# Patient Record
Sex: Female | Born: 1950 | Race: Asian | Hispanic: No | Marital: Married | Smoking: Never smoker
Health system: Southern US, Community
[De-identification: ages and names within clinical notes are randomized; demographics above are authoritative.]

## PROBLEM LIST (undated history)

## (undated) HISTORY — PX: CYSTOSCOPY KIDNEY W/ URETERAL GUIDE WIRE: SUR371

## (undated) HISTORY — PX: OTHER SURGICAL HISTORY: SHX169

---

## 2000-03-28 ENCOUNTER — Encounter: Admission: RE | Admit: 2000-03-28 | Discharge: 2000-03-28 | Payer: Self-pay | Admitting: Family Medicine

## 2000-03-28 ENCOUNTER — Encounter: Payer: Self-pay | Admitting: Family Medicine

## 2002-01-05 ENCOUNTER — Other Ambulatory Visit: Admission: RE | Admit: 2002-01-05 | Discharge: 2002-01-05 | Payer: Self-pay | Admitting: Family Medicine

## 2003-04-09 HISTORY — PX: MASTECTOMY, MODIFIED RADICAL W/RECONSTRUCTION: SHX708

## 2003-07-15 ENCOUNTER — Other Ambulatory Visit: Admission: RE | Admit: 2003-07-15 | Discharge: 2003-07-15 | Payer: Self-pay | Admitting: Family Medicine

## 2003-08-04 ENCOUNTER — Encounter: Admission: RE | Admit: 2003-08-04 | Discharge: 2003-08-04 | Payer: Self-pay | Admitting: General Surgery

## 2003-08-04 ENCOUNTER — Encounter (INDEPENDENT_AMBULATORY_CARE_PROVIDER_SITE_OTHER): Payer: Self-pay | Admitting: Specialist

## 2003-08-09 ENCOUNTER — Encounter (HOSPITAL_COMMUNITY): Admission: RE | Admit: 2003-08-09 | Discharge: 2003-11-07 | Payer: Self-pay | Admitting: General Surgery

## 2003-09-02 ENCOUNTER — Encounter (INDEPENDENT_AMBULATORY_CARE_PROVIDER_SITE_OTHER): Payer: Self-pay | Admitting: *Deleted

## 2003-09-02 ENCOUNTER — Inpatient Hospital Stay (HOSPITAL_COMMUNITY): Admission: RE | Admit: 2003-09-02 | Discharge: 2003-09-05 | Payer: Self-pay | Admitting: General Surgery

## 2003-10-05 ENCOUNTER — Ambulatory Visit (HOSPITAL_COMMUNITY): Admission: RE | Admit: 2003-10-05 | Discharge: 2003-10-05 | Payer: Self-pay | Admitting: Oncology

## 2003-10-06 ENCOUNTER — Ambulatory Visit (HOSPITAL_COMMUNITY): Admission: RE | Admit: 2003-10-06 | Discharge: 2003-10-06 | Payer: Self-pay | Admitting: Oncology

## 2003-10-12 ENCOUNTER — Ambulatory Visit (HOSPITAL_BASED_OUTPATIENT_CLINIC_OR_DEPARTMENT_OTHER): Admission: RE | Admit: 2003-10-12 | Discharge: 2003-10-12 | Payer: Self-pay | Admitting: General Surgery

## 2003-10-26 ENCOUNTER — Ambulatory Visit: Admission: RE | Admit: 2003-10-26 | Discharge: 2003-10-26 | Payer: Self-pay | Admitting: Oncology

## 2003-10-26 ENCOUNTER — Encounter: Payer: Self-pay | Admitting: Cardiovascular Disease

## 2004-01-02 ENCOUNTER — Ambulatory Visit (HOSPITAL_COMMUNITY): Admission: RE | Admit: 2004-01-02 | Discharge: 2004-01-02 | Payer: Self-pay | Admitting: Obstetrics and Gynecology

## 2004-02-07 ENCOUNTER — Ambulatory Visit: Payer: Self-pay | Admitting: Oncology

## 2004-02-27 ENCOUNTER — Ambulatory Visit: Admission: RE | Admit: 2004-02-27 | Discharge: 2004-05-04 | Payer: Self-pay | Admitting: Radiation Oncology

## 2004-03-26 ENCOUNTER — Ambulatory Visit (HOSPITAL_COMMUNITY): Admission: RE | Admit: 2004-03-26 | Discharge: 2004-03-26 | Payer: Self-pay | Admitting: Obstetrics and Gynecology

## 2004-04-06 ENCOUNTER — Ambulatory Visit (HOSPITAL_BASED_OUTPATIENT_CLINIC_OR_DEPARTMENT_OTHER): Admission: RE | Admit: 2004-04-06 | Discharge: 2004-04-06 | Payer: Self-pay | Admitting: General Surgery

## 2004-04-08 HISTORY — PX: TOTAL ABDOMINAL HYSTERECTOMY: SHX209

## 2004-04-17 ENCOUNTER — Ambulatory Visit: Payer: Self-pay | Admitting: Oncology

## 2004-07-12 ENCOUNTER — Ambulatory Visit: Admission: RE | Admit: 2004-07-12 | Discharge: 2004-07-12 | Payer: Self-pay | Admitting: Radiation Oncology

## 2004-07-25 ENCOUNTER — Ambulatory Visit: Payer: Self-pay | Admitting: Oncology

## 2004-08-02 ENCOUNTER — Ambulatory Visit: Admission: RE | Admit: 2004-08-02 | Discharge: 2004-08-02 | Payer: Self-pay | Admitting: Radiation Oncology

## 2004-08-09 ENCOUNTER — Ambulatory Visit: Admission: RE | Admit: 2004-08-09 | Discharge: 2004-08-09 | Payer: Self-pay | Admitting: Radiation Oncology

## 2004-08-21 ENCOUNTER — Encounter: Admission: RE | Admit: 2004-08-21 | Discharge: 2004-08-21 | Payer: Self-pay | Admitting: Oncology

## 2004-11-23 ENCOUNTER — Ambulatory Visit: Payer: Self-pay | Admitting: Oncology

## 2004-11-26 ENCOUNTER — Ambulatory Visit (HOSPITAL_COMMUNITY): Admission: RE | Admit: 2004-11-26 | Discharge: 2004-11-26 | Payer: Self-pay | Admitting: Obstetrics and Gynecology

## 2005-01-09 ENCOUNTER — Other Ambulatory Visit: Admission: RE | Admit: 2005-01-09 | Discharge: 2005-01-09 | Payer: Self-pay | Admitting: Obstetrics and Gynecology

## 2005-03-02 ENCOUNTER — Emergency Department (HOSPITAL_COMMUNITY): Admission: EM | Admit: 2005-03-02 | Discharge: 2005-03-02 | Payer: Self-pay | Admitting: Emergency Medicine

## 2005-03-15 ENCOUNTER — Encounter: Admission: RE | Admit: 2005-03-15 | Discharge: 2005-03-15 | Payer: Self-pay | Admitting: Family Medicine

## 2005-03-27 ENCOUNTER — Ambulatory Visit: Payer: Self-pay | Admitting: Oncology

## 2005-06-12 IMAGING — CT CT CHEST W/ CM
1 of 5 series · 11 of 32 positions shown, 17 images · IV contrast (agent unspecified)
Comparison: None.

CLINICAL DATA: History of breast cancer.  Status post right mastectomy and tram flap.  

CHEST, ABDOMEN, AND PELVIS CT WITH CONTRAST, 10/05/03
TECHNIQUE: Contiguous axial images of the chest, abdomen, and pelvis were obtained after administration of oral and 150 cc of nonionic intravenous contrast.

[Series 4: a/p 5.0 b30f · axial · 0.74mm/px · z∈[-614,-259]mm · 11 of 87 slices shown, 17 images]
[im 8/87  soft-tissue]
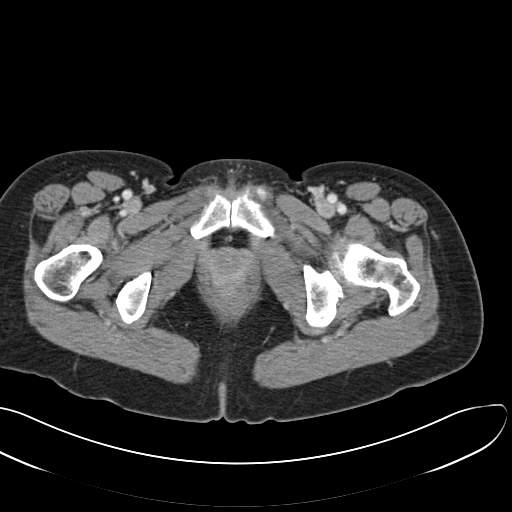
[im 8/87  bone]
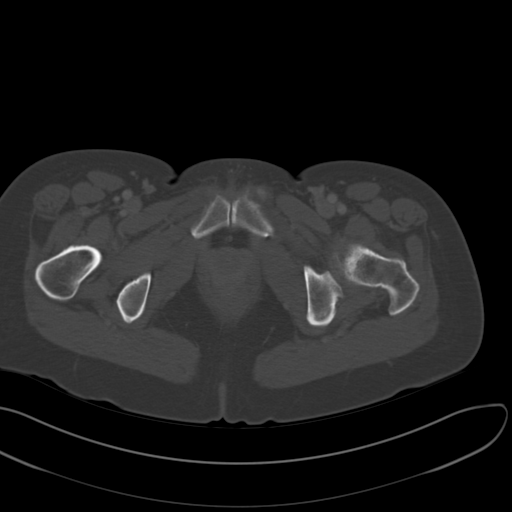
[im 15/87  soft-tissue]
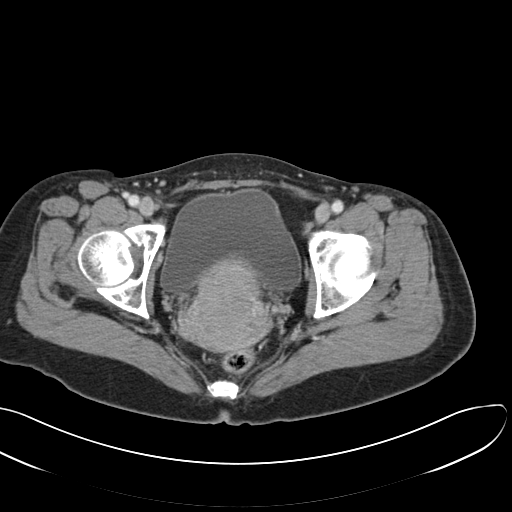
[im 22/87  soft-tissue]
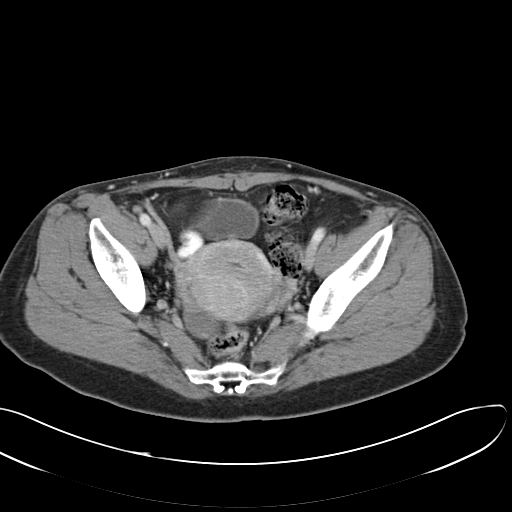
[im 29/87  soft-tissue]
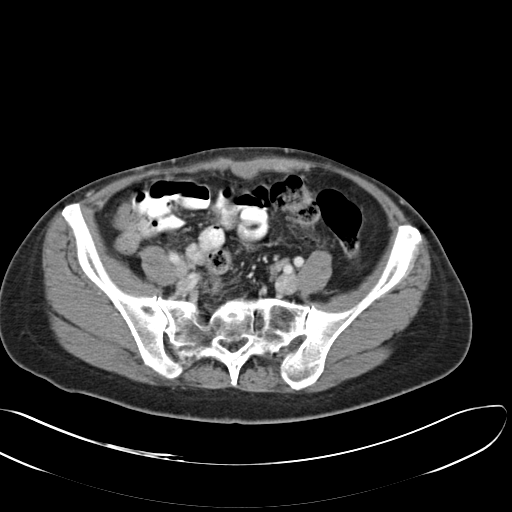
[im 36/87  soft-tissue]
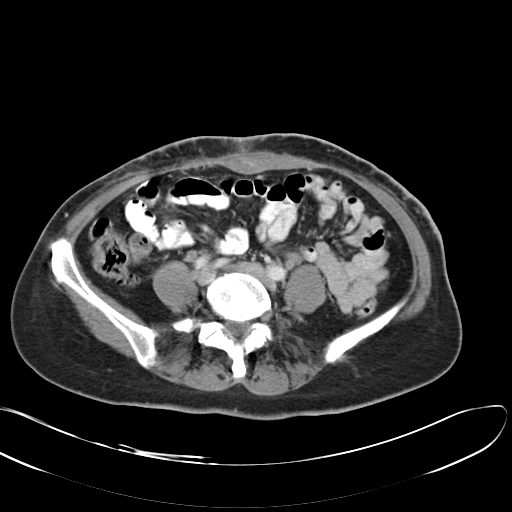
[im 44/87  soft-tissue]
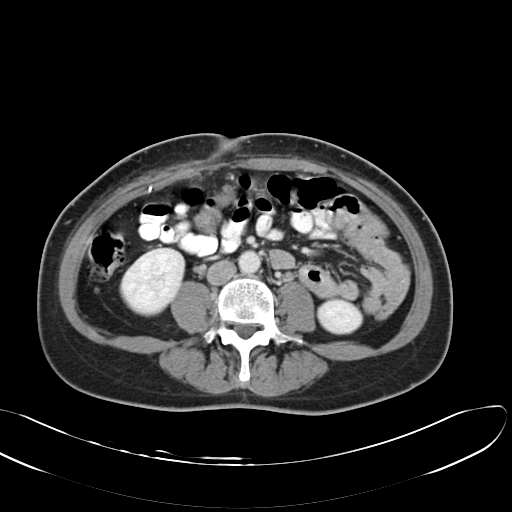
[im 51/87  soft-tissue]
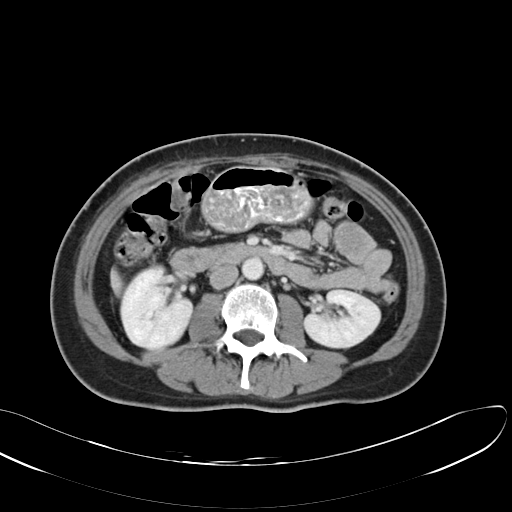
[im 58/87  soft-tissue]
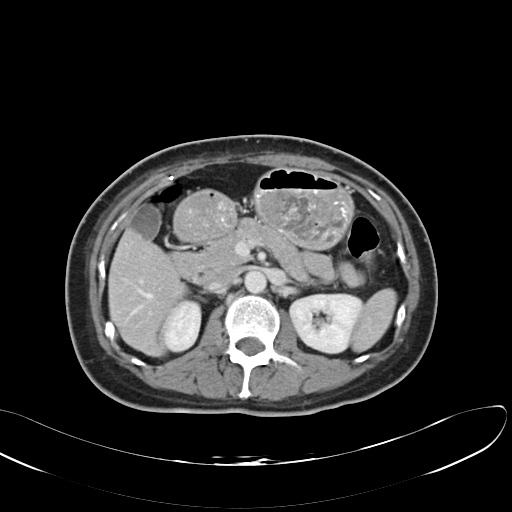
[im 58/87  lung]
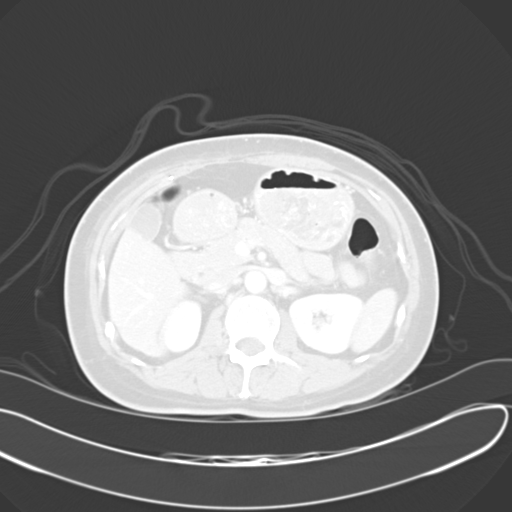
[im 65/87  soft-tissue]
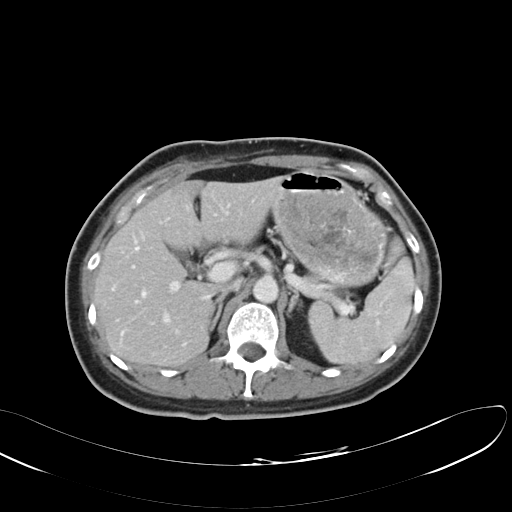
[im 65/87  lung]
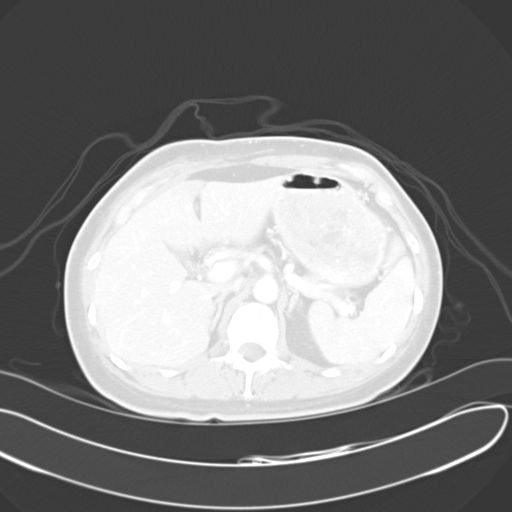
[im 65/87  bone]
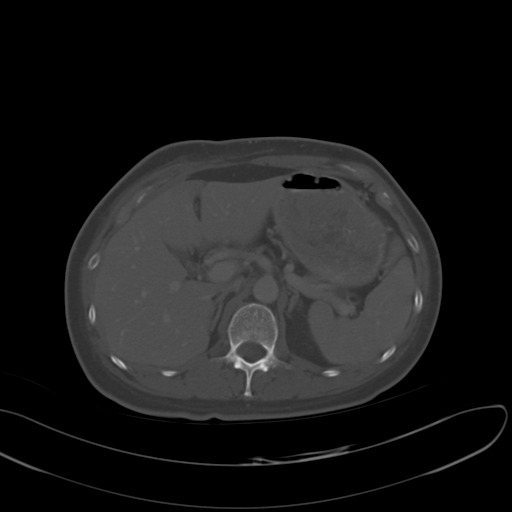
[im 72/87  soft-tissue]
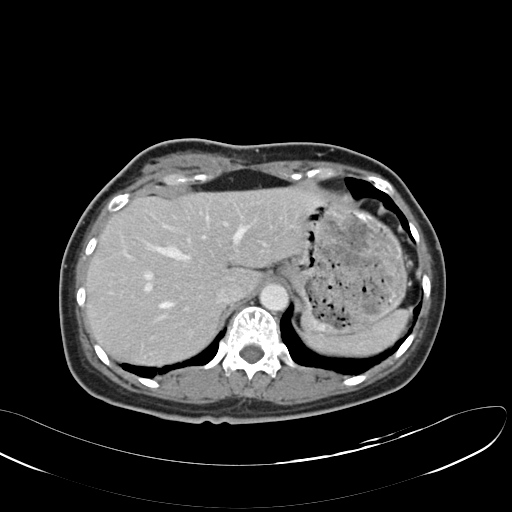
[im 72/87  lung]
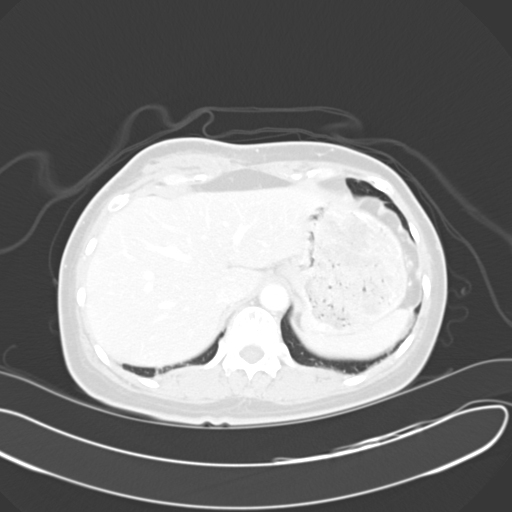
[im 79/87  soft-tissue]
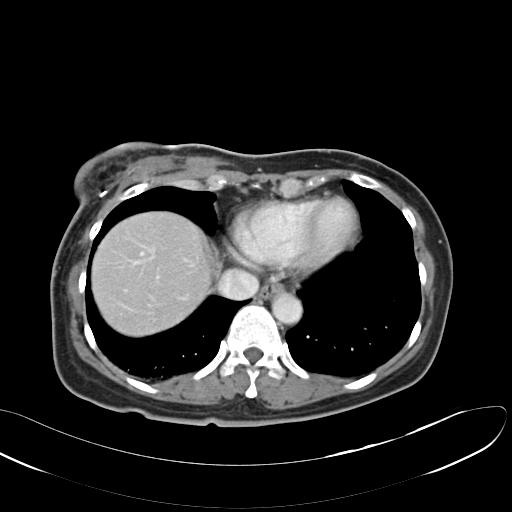
[im 79/87  lung]
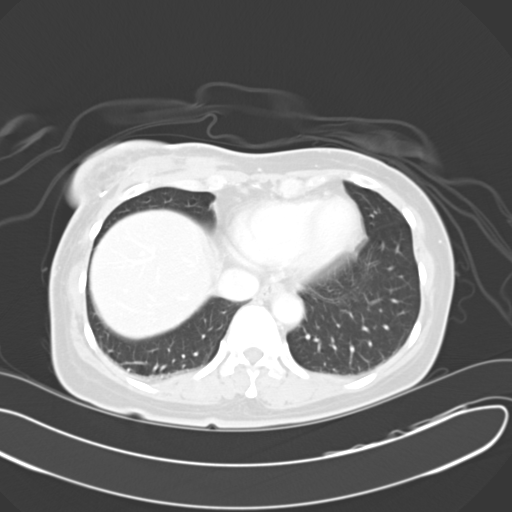

[11 of 32 positions shown; findings below may reference images not displayed]

CHEST:
There is no evidence of axillary lymphadenopathy.  No lymphadenopathy is seen in the mediastinum or either hilar region.  The heart size is normal without evidence of pericardial effusion.   There is no evidence for pleural effusion.  

Post surgical change in the right anterior abdominal wall is consistent with tram flap placement.  

Lung windows show a 4 mm nodule in the right costophrenic recess.  No parenchymal nodules or masses are seen in the left lung. 

IMPRESSION
4 mm nodule in the right costophrenic recess.  Review of the PET images from today demonstrates no increased uptake at this location.  While the lack of increased FDG uptake is reassuring, at this size, PET can be insensitive.  This lesion will require continued attenuation on follow-up imaging. 

Post surgical change in the right anterior right chest wall consistent with tram flap reconstruction.  

ABDOMEN
No focal abnormality is seen in the spleen.  A focus of low attenuation at the anterior capsule liver adjacent to the falciform ligament is in a characteristic location for focal fatty infiltration or anomalous perfusion.  A 15 mm lesion at the posterior capsule of the lateral segment of the left liver has apparent peripheral nodular enhancement.  This area was not imaged in its entirety on the renal delay sequence.  Given the subcapsular location and the peripheral nodular enhancement, this is felt to be most consistent with a cavernous hemangioma.  Review of the PET images from today demonstrates no increased uptake at this location.  

The stomach, duodenum, pancreas, and adrenal glands are unremarkable.  Prominent fold noted in the gallbladder fundus.  No focal abnormality identified in either kidney.  There is no retroperitoneal lymphadenopathy.  No intraperitoneal free fluid.  Post surgical change in the right anterior abdominal wall consistent with tram flap reconstruction. 

IMPRESSION
15 mm lesion in the posterior aspect of the lateral segment of the left liver has imaging features most consistent with benign cavernous hemangioma.  There is no increased uptake at this location on the PET CT.  Continued attenuation on follow-up imaging is recommended.  

PELVIS 
Imaging through the anatomic pelvis reveals no evidence for free fluid.  The uterus is enlarged with an irregular contour and a prominent posterior fibroid.  3.2 cm cystic lesion posterior to the right uterus is likely related to the right ovary.  There is no evidence for a left adnexal mass although a 19 mm cystic focus in the left adnexal space may be related to a dominant ovarian follicle.  

IMPRESSION
No evidence for metastatic disease in the anatomic pelvis. 

Fibroid change in the uterus.  

3.2 cm right adnexal cyst.  Follow-up ultrasound in six weeks is recommended to ensure that this resolves.  

[REDACTED]

## 2005-06-14 ENCOUNTER — Ambulatory Visit: Payer: Self-pay | Admitting: Oncology

## 2005-07-03 ENCOUNTER — Encounter: Admission: RE | Admit: 2005-07-03 | Discharge: 2005-07-03 | Payer: Self-pay | Admitting: Oncology

## 2005-10-07 ENCOUNTER — Ambulatory Visit: Payer: Self-pay | Admitting: Oncology

## 2005-10-15 ENCOUNTER — Encounter: Admission: RE | Admit: 2005-10-15 | Discharge: 2005-10-15 | Payer: Self-pay | Admitting: Oncology

## 2005-10-15 LAB — CBC WITH DIFFERENTIAL/PLATELET
Eosinophils Absolute: 0.1 10*3/uL (ref 0.0–0.5)
HCT: 36.8 % (ref 34.8–46.6)
LYMPH%: 33 % (ref 14.0–48.0)
MCHC: 34.8 g/dL (ref 32.0–36.0)
MCV: 93.7 fL (ref 81.0–101.0)
MONO#: 0.3 10*3/uL (ref 0.1–0.9)
MONO%: 8.5 % (ref 0.0–13.0)
NEUT#: 1.8 10*3/uL (ref 1.5–6.5)
NEUT%: 55.9 % (ref 39.6–76.8)
Platelets: 172 10*3/uL (ref 145–400)
RBC: 3.93 10*6/uL (ref 3.70–5.32)
WBC: 3.2 10*3/uL — ABNORMAL LOW (ref 3.9–10.0)

## 2005-10-15 LAB — COMPREHENSIVE METABOLIC PANEL
BUN: 13 mg/dL (ref 6–23)
CO2: 25 mEq/L (ref 19–32)
Calcium: 8.8 mg/dL (ref 8.4–10.5)
Chloride: 108 mEq/L (ref 96–112)
Creatinine, Ser: 0.74 mg/dL (ref 0.40–1.20)
Total Bilirubin: 0.8 mg/dL (ref 0.3–1.2)

## 2005-10-24 ENCOUNTER — Ambulatory Visit (HOSPITAL_COMMUNITY): Admission: RE | Admit: 2005-10-24 | Discharge: 2005-10-24 | Payer: Self-pay | Admitting: Oncology

## 2005-11-21 ENCOUNTER — Other Ambulatory Visit: Admission: RE | Admit: 2005-11-21 | Discharge: 2005-11-21 | Payer: Self-pay | Admitting: Obstetrics and Gynecology

## 2006-05-22 ENCOUNTER — Ambulatory Visit: Payer: Self-pay | Admitting: Oncology

## 2006-10-09 ENCOUNTER — Ambulatory Visit: Payer: Self-pay | Admitting: Oncology

## 2006-10-14 LAB — CBC WITH DIFFERENTIAL/PLATELET
BASO%: 0.5 % (ref 0.0–2.0)
Basophils Absolute: 0 10*3/uL (ref 0.0–0.1)
Eosinophils Absolute: 0 10*3/uL (ref 0.0–0.5)
HCT: 39.9 % (ref 34.8–46.6)
HGB: 14.3 g/dL (ref 11.6–15.9)
MONO#: 0.4 10*3/uL (ref 0.1–0.9)
NEUT#: 2.1 10*3/uL (ref 1.5–6.5)
NEUT%: 52.9 % (ref 39.6–76.8)
Platelets: 182 10*3/uL (ref 145–400)
WBC: 4 10*3/uL (ref 3.9–10.0)
lymph#: 1.4 10*3/uL (ref 0.9–3.3)

## 2006-10-14 LAB — COMPREHENSIVE METABOLIC PANEL
ALT: 18 U/L (ref 0–35)
BUN: 11 mg/dL (ref 6–23)
CO2: 25 mEq/L (ref 19–32)
Calcium: 9.8 mg/dL (ref 8.4–10.5)
Chloride: 105 mEq/L (ref 96–112)
Creatinine, Ser: 0.68 mg/dL (ref 0.40–1.20)
Glucose, Bld: 78 mg/dL (ref 70–99)

## 2006-10-14 LAB — CANCER ANTIGEN 27.29: CA 27.29: 8 U/mL (ref 0–39)

## 2006-10-30 ENCOUNTER — Encounter: Admission: RE | Admit: 2006-10-30 | Discharge: 2006-10-30 | Payer: Self-pay | Admitting: Oncology

## 2007-02-26 ENCOUNTER — Ambulatory Visit: Payer: Self-pay | Admitting: Oncology

## 2007-02-26 ENCOUNTER — Ambulatory Visit (HOSPITAL_COMMUNITY): Admission: RE | Admit: 2007-02-26 | Discharge: 2007-02-26 | Payer: Self-pay | Admitting: Family Medicine

## 2007-02-26 LAB — BASIC METABOLIC PANEL
Calcium: 9.4 mg/dL (ref 8.4–10.5)
Chloride: 106 mEq/L (ref 96–112)
Creatinine, Ser: 0.56 mg/dL (ref 0.40–1.20)
Sodium: 144 mEq/L (ref 135–145)

## 2007-03-25 ENCOUNTER — Other Ambulatory Visit: Admission: RE | Admit: 2007-03-25 | Discharge: 2007-03-25 | Payer: Self-pay | Admitting: Obstetrics and Gynecology

## 2007-04-13 ENCOUNTER — Ambulatory Visit: Payer: Self-pay | Admitting: Oncology

## 2007-04-23 ENCOUNTER — Ambulatory Visit (HOSPITAL_COMMUNITY): Admission: RE | Admit: 2007-04-23 | Discharge: 2007-04-23 | Payer: Self-pay | Admitting: Oncology

## 2007-09-22 ENCOUNTER — Other Ambulatory Visit: Admission: RE | Admit: 2007-09-22 | Discharge: 2007-09-22 | Payer: Self-pay | Admitting: Obstetrics and Gynecology

## 2007-11-03 ENCOUNTER — Encounter: Admission: RE | Admit: 2007-11-03 | Discharge: 2007-11-03 | Payer: Self-pay | Admitting: Oncology

## 2007-12-09 ENCOUNTER — Ambulatory Visit: Payer: Self-pay | Admitting: Oncology

## 2007-12-15 LAB — CBC WITH DIFFERENTIAL/PLATELET
BASO%: 0.9 % (ref 0.0–2.0)
EOS%: 2.3 % (ref 0.0–7.0)
HGB: 13.9 g/dL (ref 11.6–15.9)
MCH: 32.3 pg (ref 26.0–34.0)
MCHC: 33.7 g/dL (ref 32.0–36.0)
MCV: 95.9 fL (ref 81.0–101.0)
MONO%: 10.7 % (ref 0.0–13.0)
RBC: 4.29 10*6/uL (ref 3.70–5.32)
RDW: 11.6 % (ref 11.3–14.5)
lymph#: 1.2 10*3/uL (ref 0.9–3.3)

## 2007-12-16 LAB — COMPREHENSIVE METABOLIC PANEL
ALT: 20 U/L (ref 0–35)
AST: 19 U/L (ref 0–37)
Albumin: 4.4 g/dL (ref 3.5–5.2)
Alkaline Phosphatase: 61 U/L (ref 39–117)
BUN: 13 mg/dL (ref 6–23)
Calcium: 9.5 mg/dL (ref 8.4–10.5)
Chloride: 106 mEq/L (ref 96–112)
Potassium: 3.9 mEq/L (ref 3.5–5.3)

## 2008-06-01 ENCOUNTER — Other Ambulatory Visit: Admission: RE | Admit: 2008-06-01 | Discharge: 2008-06-01 | Payer: Self-pay | Admitting: Obstetrics and Gynecology

## 2008-10-14 ENCOUNTER — Ambulatory Visit: Payer: Self-pay | Admitting: Oncology

## 2008-10-18 LAB — CBC WITH DIFFERENTIAL/PLATELET
BASO%: 0.4 % (ref 0.0–2.0)
HCT: 39.3 % (ref 34.8–46.6)
HGB: 13.8 g/dL (ref 11.6–15.9)
MCHC: 35.2 g/dL (ref 31.5–36.0)
MONO#: 0.3 10*3/uL (ref 0.1–0.9)
NEUT%: 52.6 % (ref 38.4–76.8)
RDW: 12.5 % (ref 11.2–14.5)
WBC: 3.7 10*3/uL — ABNORMAL LOW (ref 3.9–10.3)
lymph#: 1.3 10*3/uL (ref 0.9–3.3)

## 2008-10-19 LAB — COMPREHENSIVE METABOLIC PANEL
ALT: 15 U/L (ref 0–35)
AST: 17 U/L (ref 0–37)
Albumin: 4.2 g/dL (ref 3.5–5.2)
Alkaline Phosphatase: 53 U/L (ref 39–117)
BUN: 11 mg/dL (ref 6–23)
Calcium: 9.1 mg/dL (ref 8.4–10.5)
Chloride: 108 mEq/L (ref 96–112)
Potassium: 4 mEq/L (ref 3.5–5.3)
Sodium: 140 mEq/L (ref 135–145)
Total Protein: 7 g/dL (ref 6.0–8.3)

## 2008-11-08 ENCOUNTER — Encounter: Admission: RE | Admit: 2008-11-08 | Discharge: 2008-11-08 | Payer: Self-pay | Admitting: Oncology

## 2009-05-18 ENCOUNTER — Ambulatory Visit: Payer: Self-pay | Admitting: Oncology

## 2010-04-29 ENCOUNTER — Encounter: Payer: Self-pay | Admitting: Obstetrics and Gynecology

## 2010-04-29 ENCOUNTER — Encounter: Payer: Self-pay | Admitting: General Surgery

## 2010-08-24 NOTE — Op Note (Signed)
NAME:  Sharon Reyes, Sharon Reyes                      ACCOUNT NO.:  [id number]   MEDICAL RECORD NO.:  [id number]                   PATIENT TYPE:  OUT   LOCATION:  XRAY                                 FACILITY:  [facility]   PHYSICIAN:  Rose Phi. Maple Hudson, M.D.                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   OPERATION PERFORMED:  Insertion of Port-A-Cath.   SURGEON:  Rose Phi. Maple Hudson, M.D.   ANESTHESIA:  MAC.   DESCRIPTION OF PROCEDURE:  The patient was placed on the operating table  with a roll between the shoulders and the left upper chest and neck prepped  and draped in the usual fashion.  Under local anesthesia, a left subclavian  puncture was carried out without difficulty and the guidewire inserted and  proper position of the wire was confirmed by fluoroscopy.  Under local  anesthesia made an anterior incision and developed a pocket for the  implantable port.  We then tunneled between the puncture site and the port  site and passed the catheter and connected it to the X-Port which I then  fixed in the pocket with two 2-0 Prolene sutures.  The system had been  previously flushed.  I then trimmed it to go to the fourth interspace over  the cavoatrial junction.  We passed the dilator and peel-away sheath over  the wire and removed the wire, then the dilator and passed the catheter  through the peel-away sheath and then removed it.  Again, using fluoroscopy,  proper positioning of the catheter tip was confirmed in the superior vena  cava and there was no kinking in the system.  I then flushed it with  heparinized solution and then fully heparinized it and it both aspirated and  flushed easily.   The incisions were closed with 3-0 Vicryl and subcuticular  4-0 Monocryl and  Steri-Strips.  Dressings were applied.  The patient was then transferred to  the recovery room in satisfactory condition having tolerated the procedure  well.                                               Rose Phi. Maple Hudson, M.D.    PRY/MEDQ  D:  10/12/2003  T:  10/12/2003  Job:  [id number]

## 2010-08-24 NOTE — Op Note (Signed)
[name], [name] NO.:  [id number]   MEDICAL RECORD NO.:  [id number]                   PATIENT TYPE:  INP   LOCATION:  5738                                 FACILITY:  [facility]   PHYSICIAN:  [name], M.D.                  DATE OF BIRTH:  05/30/1950   DATE OF PROCEDURE:  09/02/2003  DATE OF DISCHARGE:                                 OPERATIVE REPORT   PREOPERATIVE DIAGNOSIS:  Right breast cancer.   POSTOPERATIVE DIAGNOSIS:  Right breast cancer.   PROCEDURE:  1. Right breast reconstruction using ipsilateral pedicle transverse rectus     bones in a tissue flap.  2. Placement of an On Que catheter.   SURGEON:  [name], M.D.   ASSISTANT:  Pleas Patricia, M.D.   ANESTHESIA:  General.   ESTIMATED BLOOD LOSS:  About 300 cc for the TRAM flap and the mastectomy  combined.   DRAINS:  One Harrison Mons for the chest, two Blake drains for the abdomen.   CLINICAL NOTE:  This 60 year old woman has right breast cancer.  She desired  breast reconstruction if her sentinel lymph node was negative.  This was  discussed with her in great detail, and she preferred to use her own tissues  and the TRAM flap.   The procedure, risks, complications were discussed with her in great detail  and she understood the procedure and the risks, and wished to proceed.   DESCRIPTION OF PROCEDURE:  The patient had already been marked  preoperatively for the TRAM flap.  Her sentinel lymph node was indeed  negative.  The mastectomy completed and she tolerated that well.  An  incision was made around the umbilicus and generous amount left on the  contralateral side away from the pedicle in order to preserve blood supply  to the umbilicus.  It was dissected free from surrounding tissue.  The upper  limb of the planned elliptical incision was then made and dissection carried  down to the underlying fascia.  The dissection was then carried out in a  cephalad direction, taking  great care to avoid damage to the underlying  fascia.  A through-and-through connection was made to the mastectomy site,  creating a subcutaneous tunnel.  The patient was placed in a seating  position, briefly and the lower limb of the incision was checked.  She was  then returned supine and the lower limb of the incision made.  The flap was  then elevated from the left side over to the midline using electrocautery.  On the right side, it was elevated up to the lateral row of perforators.  Her incision was then made in the anterior fascia, opening the tissue and  gaining a wide of strip of anteriorly.  This was continued down the full  length of the flap.  Great care was taken to avoid damage to the underlying  muscle.  The muscle was  then dissected free from its surrounding fascial  investments, using the bipolar as well as the scalpel at the tendinous  inscriptions.  The muscle had been freed circumferentially, deep inferior  epigastric vessels were identified and the rectus muscle was divided  distally taking great care to avoid damage to the deep inferior vessels.   Attention was then directed to the chest where there was thorough irrigation  with saline and meticulous hemostasis with the electrocautery.  A Blake  drain was positioned and brought through a separate stab wound  inferolaterally and secured with the 3-0 Prolene suture.  Excellent  hemostasis had been confirmed.   Attention was then directed to the abdomen.  The deep inferior gastric  vessels were identified, artery was mobilized, triply ligated proximally and  distally and divided, and doubly ligated proximally, distally and divided.  The muscle flap was then elevated and was passed through the subcutaneous  tunnel gently onto the chest.  The muscle pedicle was checked to make sure  there was no torsion and that there was no tension.  The flap was temporally  stapled into position.  The flap had excellent color and  excellent capillary  refill.   The abdomen was irrigated thoroughly with saline.  The muscle stump was  checked and was cauterized with electrocautery.  Excellent hemostasis was  confirmed.  The closure was with 0 Prolene, interrupted figure-of-eight  sutures, taking great care to avoid damage to the underlying intra-abdominal  contents.  Onlay mesh was then applied and the umbilicus brought through the  mesh and the mesh was secured around its periphery using 2-0 Prolene simple  interrupted sutures.  Again, great care was taken to avoid damage to  underlying abdominal contents.  Thorough irrigation was with saline and the  abdominal closure of 2-0 Vicryl, interrupted, inverted deep sutures and a  running 3-0 Monocryl subcuticular suture.  An incision was then made at the  midline for the umbilicus which was brought that opening and secured with 3-  0 Vicryl interrupted, inverted deep sutures and 5-0 nylon simple interrupted  sutures.  The abdominal wall had excellent color as did the umbilicus.  No  vascular compromise.  Prior to the abdominal closure, Blake drains were  positioned, brought out through separate stab wounds inferiorly and secured  with 3-0 running sutures.  In addition, an On Que pump catheter was also  positioned passing the needles up to the subcutaneous tissue in a zig-zag  fashion and then these were also positioned and the pump primed.   Attention was directed back to the chest.  The flap had excellent color.  Zone four was excised in its entirety and a portion of zone two.  There was  bright red bleeding at the periphery at these excision points.  The skin had  excellent color and capillary refill.  The insetting with 3-0 Monocryl,  interrupted deep sutures and a running 3-0 Monocryl subcuticular suture  along the inferior border where it was inset, skin edges to skin edges.  The flap was then moved superiorly and de-epithelialized as needed.  The flap  was then  inset suspending it with 3-0 Vicryl interrupted horizontal mattress  sutures superiorly.  The portion of the flap that was buried was de-  epithelialized.  The skin pedicle inset was then completed using 3-0  Monocryl interrupted for deep sutures and a running 3-0 Monocryl  subcuticular suture.   The sentinel lymph node biopsy site also was closed with a 3-0  Vicryl,  interrupted buried deep sutures.  Steri-Strips and dry sterile dressing were  applied, and she was transported to the recovery room in stable condition.                                               [name], M.D.    DB/MEDQ  D:  09/02/2003  T:  09/03/2003  Job:  [id number]

## 2010-08-24 NOTE — Op Note (Signed)
NAME:  Sharon Reyes, Sharon Reyes NO.:  [id number]   MEDICAL RECORD NO.:  [id number]          PATIENT TYPE:  AMB   LOCATION:  DSC                          FACILITY:  [facility]   PHYSICIAN:  Rose Phi. Maple Hudson, M.D.   DATE OF BIRTH:  [birth year]   DATE OF PROCEDURE:  04/06/2004  DATE OF DISCHARGE:                                 OPERATIVE REPORT   PREOPERATIVE DIAGNOSIS:  Carcinoma of the breast.   POSTOPERATIVE DIAGNOSIS:  Carcinoma of the breast.   OPERATION:  Removal of Port-A-Cath.   SURGEON:  Rose Phi. Maple Hudson, M.D.   ANESTHESIA:  Local.   OPERATIVE PROCEDURE:  Patient was placed on the operating table and the left  upper chest prepped and draped in the usual fashion.  After obtaining good  local anesthesia at the Port-A-Cath site, the old incision was incised and  the port exposed.  The catheter was then grasped and removed from the  subclavian vein.  We then divided the 2 sutures, holding the port in place  and slipped it out of the pocket.  There was no bleeding.   A subcuticular closure of 4-0 Monocryl and Steri-Strips was carried out, a  dressing applied and the patient allowed to go home.      Pete   PRY/MEDQ  D:  04/06/2004  T:  04/06/2004  Job:  [id number]

## 2010-08-24 NOTE — Op Note (Signed)
NAMESIERRA, Sharon Reyes                       ACCOUNT NO.:  [id number]   MEDICAL RECORD NO.:  [id number]                   PATIENT TYPE:  INP   LOCATION:  2899                                 FACILITY:  [facility]   PHYSICIAN:  Rose Phi. Maple Hudson, M.D.                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   OPERATION:  1. Blue dye injection.  2. Right axillary sentinel lymph node biopsy.  3. Right total mastectomy.   SURGEON:  Rose Phi. Maple Hudson, M.D.   ASSISTANT:  Currie Paris, M.D.   ANESTHESIA:  General.   OPERATIVE PROCEDURE:  Prior to coming to the operating room, 1 mCi of  technetium sulfur colloid was injected intradermally.   After suitable general anesthesia was induced, the patient was placed in the  supine position with the arms extended on the arm board.  Five milliliters  of a mixture of 2 mL of methylene blue and 3 mL of saline was injected in  the subareolar tissue and the breast gently massaged for about three  minutes.   After prepping and draping, we made transverse elliptical incision, at least  outlined it.  We then made a short transverse incision in the right axilla  and dissected through the subcutaneous tissue to the clavipectoral fascia.  Deep to the fascia was a blue and hot lymph node, and I removed that as a  sentinel node, then found two other additional blue and hot nodes, which  were also submitted as sentinel nodes.   While that was being done, the transverse elliptical incision was then made  and the flaps dissected in the standard fashion, going superiorly to near  the clavicle and medially to the sternum and inferiorly to the rectus fascia  and laterally to the latissimus dorsi muscle.  As we began to dissect the  breast  off from the chest wall incorporating the pectoralis fascia, we were  notified that the sentinel nodes were negative.  We then completed the  dissection and dissected along the pectoralis major and minor muscle and  removed the breast in the very tail of the tissue there.  Hemostasis  obtained with the cautery.  We thoroughly irrigated the field with saline.  We had good hemostasis.   [name], M.D., was then coming in to do a TRAM reconstruction, which  will be dictated in a separate note.                                               Rose Phi. Maple Hudson, M.D.   PRY/MEDQ  D:  09/02/2003  T:  09/03/2003  Job:  [id number]

## 2010-08-24 NOTE — Discharge Summary (Signed)
Sharon Reyes, Sharon Reyes                       ACCOUNT NO.:  [id number]   MEDICAL RECORD NO.:  [id number]                   PATIENT TYPE:  INP   LOCATION:  5738                                 FACILITY:  [facility]   PHYSICIAN:  [name], M.D.                  DATE OF BIRTH:  [birth year]   DATE OF ADMISSION:  09/02/2003  DATE OF DISCHARGE:  09/05/2003                                 DISCHARGE SUMMARY   FINAL DIAGNOSIS:  Right breast cancer and necrotic abscess of the right  breast.   PROCEDURE PERFORMED:  1. Right total mastectomy with sentinel lymph node biopsy.  2. Right breast reconstruction with an isolateral transverse rectus     abdominis myocutaneous flap.  3. Placement of an OnQ catheter.   SUMMARY OF HISTORY:  A 60 year old woman with a right breast cancer. Various  options discussed with her, and mastectomy was recommended. She was  interested in breast reconstruction and preferred using her own tissue  rather than a tissue expander followed by implant. The nature of these  procedures and risks were discussed with her in detail. The patient  understood these risks and possible complications and wished to proceed. For  further details of history and physical, please see the chart.   COURSE IN THE HOSPITAL:  On admission, she was taken to surgery at which  time the sentinel lymph node biopsy and total mastectomy were performed.  Sentinel lymph node biopsy was negative. Breast reconstruction was performed  using the TRAM flap. She tolerated these procedures extremely well.  Postoperatively, she did well. She had some low grade fever that responded  to incentive spirometry and ambulation. Postoperative hemoglobin 9.3; two  days later it was 9.6 and stable. Vital signs remained stable. Flap had  excellent color and excellent capillary refill and viable. Abdominal closure  also viable with good color in the distal skin. Drain functioning. Overall  doing very well. She was  tolerating a regular diet by the first  postoperative day. She is ready to be discharged. The OnQ catheters were  removed prior to her discharge this morning.   DISPOSITION:  She is discharged on a regular diet. Instructed to avoid any  lifting, vigorous activities or exercising. No shower yet. She will empty  the drains times a day and record the amounts. Darvocet-N 100 for pain,  total of 30 given, one p.o. q.3-4h. p.r.n. for pain, and I recommend that  she be on Colace 100 mg p.o. b.i.d. and a stool softener. We will see her  back in the office next week, and she also return to see Dr. Maple Hudson next week  as well. Her condition at discharge is excellent.  [name], M.D.    DB/MEDQ  D:  09/05/2003  T:  09/05/2003  Job:  [id number]

## 2013-09-21 ENCOUNTER — Ambulatory Visit: Payer: Self-pay | Admitting: Internal Medicine

## 2013-09-22 ENCOUNTER — Encounter: Payer: Self-pay | Admitting: Internal Medicine

## 2013-09-22 ENCOUNTER — Ambulatory Visit: Payer: Self-pay | Admitting: Internal Medicine

## 2013-09-22 NOTE — Progress Notes (Signed)
Patient ID: Sharon BodilyWang Ling Reyes, female   DOB: 01/12/1951, 63 y.o.   MRN: [id number]     [facility] [place]  O    S  H  O  W

## 2016-10-24 ENCOUNTER — Other Ambulatory Visit: Payer: Self-pay | Admitting: Internal Medicine

## 2017-06-10 NOTE — Progress Notes (Signed)
Encounter to establish care with new provider  Assessment and Plan:  Sharon Reyes was seen today for establish care.  Diagnoses and all orders for this visit:  Encounter to establish care with new doctor She will verify immunizations and exact dates for care received in [place] to be followed up at next visit with [facility] for medicare -   Medication management -     CBC with Differential/Platelet -     BASIC METABOLIC PANEL WITH GFR -     Hepatic function panel -     Urinalysis w microscopic + reflex cultur  Personal history of breast cancer S/p mastectomy, continues with annual mammograms in [place]  Environmental allergies Continue OTC allergy pills, hygiene and avoiding triggers discussed  Dysthymia Continue medications  Lifestyle discussed: diet/exerise, sleep hygiene, stress management, hydration -     sertraline (ZOLOFT) 50 MG tablet; Take 1 tablet (50 mg total) by mouth 2 (two) times daily.  Hyperlipidemia, unspecified hyperlipidemia type Patient reports ongoing; not currently on treatment-  Continue low cholesterol diet and exercise.  -     Lipid panel  Vitamin D deficiency -     VITAMIN D 25 Hydroxy (Vit-D Deficiency, Fractures)  Screening for colon cancer Overdue; last was normal -  -     Ambulatory referral to Gastroenterology  Other abnormal glucose -     Hemoglobin A1c  Irritable bowel syndrome with both constipation and diarrhea If not on benefiber then add it, decrease stress,  if any worsening symptoms, blood in stool, AB pain, etc call office  Discussed med's effects and SE's. Screening labs and tests as requested with regular follow-up as recommended. Over 40 minutes of exam, counseling, chart review, and complex, high level critical decision making was performed this visit.   No future appointments.     HPI  BP 100/60   Pulse 81   Temp 97.7 F (36.5 C)   Ht 5\' 5"  (1.651 m)   Wt 131 lb (59.4 kg)   SpO2 97%   BMI 21.80 kg/m   67 y.o. married  [place] female mother of 2 and now a grandmother,  presents to establish care. She spends about 50% of the year in [place] and has extensive medical care there due to free care. She now has medicare here. She has Personal history of breast cancer; Environmental allergies; Depression; Hyperlipidemia; and Irritable bowel syndrome (IBS) on their problem list.   She had stage 2B breast cancer in 2000; s/p mastectomy with reconstruction that year, also had chemo/radiation and followed by 5 years of tamoxifen and other oral drug she cannot recall; has been released. Continues with annual mammograms. She also reports hx of cervical dysplasia for which she had total abdominal hysterectomy in 2006. 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   She reports "my cholesterol has always been high" but has not been treated by medications for this thus far. She also has hx of depression/dysthymia ongoing since breast cancer; she was placed on zoloft 50 mg BID at that time and has continued this since with perceived ongoing benefit with mood.   She also reports ongoing symptoms of alternating constipation and diarrhea accompanied by bloating and  abdominal discomfort for 4 years or so. She has not tried any interventions for this,   BMI is Body mass index is 21.8 kg/m., she has not been working  on diet and exercise. Wt Readings from Last 3 Encounters:  06/11/17 131 lb (59.4 kg)    Current Medications:  Current Outpatient Medications on File Prior to Visit  Medication Sig Dispense Refill  . fexofenadine (ALLEGRA) 180 MG tablet Take 180 mg by mouth as needed for allergies or rhinitis.     No current facility-administered medications on file prior to visit.    Allergies:  Not on File Medical History:  She has Personal history of breast  cancer; Environmental allergies; Depression; Hyperlipidemia; and Irritable bowel syndrome (IBS) on their problem list. Health Maintenance:    There is no immunization history on file for this patient.  Tetanus: Not sure - defer today Pneumovax: 12/2016? Prevnar 13:  Flu vaccine: 2018 Shingrix: n/a  MGM: Has done in [place], 2018 DEXA: Hasn't had - get at next visit Colonoscopy: 2006 EGD: -  Last Dental Exam: Sees q6 month in [place] Last [place] Exam: As needed, several years ago in [place]  Patient Care Team: Lucky Cowboy, MD as PCP - General (Internal Medicine) Lucky Cowboy, MD as PCP - Internal Medicine (Internal Medicine)  Surgical History:  She has a past surgical history that includes Mastectomy, modified radical w/reconstruction (Right, 2005); Total abdominal hysterectomy (2006); Cystoscopy kidney w/ ureteral guide wire (Left); and OTHER SURGICAL HISTORY (Left). Family History:  Herfamily history includes Breast cancer in her sister; Cirrhosis in her mother; Diabetes in her brother; Lung cancer in her maternal uncle; Uterine cancer in her sister. Social History:  She reports that  has never smoked. she has never used smokeless tobacco. She reports that she does not drink alcohol or use drugs.  Review of Systems: Review of Systems  Constitutional: Negative for chills, diaphoresis, fever, malaise/fatigue and weight loss.  HENT: Negative for hearing loss and tinnitus.   Eyes: Negative for blurred vision and double vision.  Respiratory: Negative for cough, sputum production, shortness of breath and wheezing.   Cardiovascular: Negative for chest pain, palpitations, orthopnea, claudication and leg swelling.  Gastrointestinal: Positive for constipation and diarrhea. Negative for abdominal pain, blood in stool, heartburn, melena, nausea and vomiting.  Genitourinary: Negative.   Musculoskeletal: Negative for falls, joint pain and myalgias.  Skin: Negative for rash.   Neurological: Negative for dizziness, tingling, sensory change, weakness and headaches.  Endo/Heme/Allergies: Negative for polydipsia.  Psychiatric/Behavioral: Negative.  Negative for depression, memory loss, substance abuse and suicidal ideas. The patient is not nervous/anxious and does not have insomnia.   All other systems reviewed and are negative.   Physical Exam: Estimated body mass index is 21.8 kg/m as calculated from the following:   Height as of this encounter: 5\' 5"  (1.651 m).   Weight as of this encounter: 131 lb (59.4 kg). BP 100/60   Pulse 81   Temp 97.7 F (36.5 C)   Ht 5\' 5"  (1.651 m)   Wt 131 lb (59.4 kg)   SpO2 97%   BMI 21.80 kg/m  General Appearance: Well nourished, well dressed, slim Asian woman in no apparent distress.  Eyes: PERRLA, EOMs, conjunctiva no swelling or erythema, normal fundi and vessels.  Sinuses: No Frontal/maxillary tenderness  ENT/Mouth: Ext aud canals clear, normal light reflex with TMs without erythema, bulging. Good dentition. No erythema, swelling, or exudate on post pharynx. Tonsils not swollen or erythematous. Hearing normal.  Neck: Supple, thyroid normal. No bruits  Respiratory: Respiratory effort normal, BS equal bilaterally without rales, rhonchi, wheezing or stridor.  Cardio: RRR without murmurs, rubs or gallops. Brisk peripheral pulses without  edema.  Chest: symmetric, with normal excursions and percussion.  Breasts: Symmetric, without lumps, nipple discharge, retractions.  Abdomen: Soft, nontender, no guarding, rebound, hernias, masses, or organomegaly.  Lymphatics: Non tender without lymphadenopathy.  Genitourinary:  Musculoskeletal: Full ROM all peripheral extremities,5/5 strength, and normal gait.  Skin: Warm, dry without rashes, lesions, ecchymosis. Neuro: Cranial nerves intact, reflexes equal bilaterally. Normal muscle tone, no cerebellar symptoms. Sensation intact.  Psych: Awake and oriented X 3, normal affect, Insight and  Judgment appropriate.   EKG: defer-  Dan MakerAshley C Miriana Gaertner 5:51 PM [facility]

## 2017-06-11 ENCOUNTER — Encounter: Payer: Self-pay | Admitting: Adult Health

## 2017-06-11 ENCOUNTER — Ambulatory Visit (INDEPENDENT_AMBULATORY_CARE_PROVIDER_SITE_OTHER): Payer: PPO | Admitting: Adult Health

## 2017-06-11 VITALS — BP 100/60 | HR 81 | Temp 97.7°F | Ht 65.0 in | Wt 131.0 lb

## 2017-06-11 DIAGNOSIS — F341 Dysthymic disorder: Secondary | ICD-10-CM

## 2017-06-11 DIAGNOSIS — K589 Irritable bowel syndrome without diarrhea: Secondary | ICD-10-CM | POA: Insufficient documentation

## 2017-06-11 DIAGNOSIS — R7309 Other abnormal glucose: Secondary | ICD-10-CM

## 2017-06-11 DIAGNOSIS — E559 Vitamin D deficiency, unspecified: Secondary | ICD-10-CM | POA: Diagnosis not present

## 2017-06-11 DIAGNOSIS — E785 Hyperlipidemia, unspecified: Secondary | ICD-10-CM | POA: Insufficient documentation

## 2017-06-11 DIAGNOSIS — Z9109 Other allergy status, other than to drugs and biological substances: Secondary | ICD-10-CM | POA: Insufficient documentation

## 2017-06-11 DIAGNOSIS — K582 Mixed irritable bowel syndrome: Secondary | ICD-10-CM | POA: Diagnosis not present

## 2017-06-11 DIAGNOSIS — Z79899 Other long term (current) drug therapy: Secondary | ICD-10-CM

## 2017-06-11 DIAGNOSIS — Z1211 Encounter for screening for malignant neoplasm of colon: Secondary | ICD-10-CM | POA: Diagnosis not present

## 2017-06-11 DIAGNOSIS — Z853 Personal history of malignant neoplasm of breast: Secondary | ICD-10-CM | POA: Diagnosis not present

## 2017-06-11 DIAGNOSIS — Z7689 Persons encountering health services in other specified circumstances: Secondary | ICD-10-CM | POA: Diagnosis not present

## 2017-06-11 DIAGNOSIS — F329 Major depressive disorder, single episode, unspecified: Secondary | ICD-10-CM | POA: Insufficient documentation

## 2017-06-11 DIAGNOSIS — F32A Depression, unspecified: Secondary | ICD-10-CM | POA: Insufficient documentation

## 2017-06-11 MED ORDER — SERTRALINE HCL 50 MG PO TABS: 50.0000 mg | ORAL_TABLET | Freq: Two times a day (BID) | ORAL | 1 refills | Status: DC

## 2017-06-11 NOTE — Patient Instructions (Addendum)
Ask about tetanus vaccine (Td or Tdap), shingrix  Check which pneumonia vaccine you got - pneumovax 23 or prevnar 13   Aim for 7+ servings of fruits and vegetables daily  80+ fluid ounces of water or unsweet tea for healthy kidneys  1 drink of alcohol per day  Limit animal fats in diet for cholesterol and heart health - choose grass fed whenever available  Aim for low stress - take time to unwind and care for your mental health  Aim for 150 min of moderate intensity exercise weekly for heart health, and weights twice weekly for bone health  Aim for 7-9 hours of sleep daily   Irritable Bowel Syndrome, Adult Irritable bowel syndrome (IBS) is not one specific disease. It is a group of symptoms that affects the organs responsible for digestion (gastrointestinal or GI tract). To regulate how your GI tract works, your body sends signals back and forth between your intestines and your brain. If you have IBS, there may be a problem with these signals. As a result, your GI tract does not function normally. Your intestines may become more sensitive and overreact to certain things. This is especially true when you eat certain foods or when you are under stress. There are four types of IBS. These may be determined based on the consistency of your stool:  IBS with diarrhea.  IBS with constipation.  Mixed IBS.  Unsubtyped IBS.  It is important to know which type of IBS you have. Some treatments are more likely to be helpful for certain types of IBS. What are the causes? The exact cause of IBS is not known. What increases the risk? You may have a higher risk of IBS if:  You are a woman.  You are younger than 66 years old.  You have a family history of IBS.  You have mental health problems.  You have had bacterial infection of your GI tract.  What are the signs or symptoms? Symptoms of IBS vary from person to person. The main symptom is abdominal pain or discomfort. Additional  symptoms usually include one or more of the following:  Diarrhea, constipation, or both.  Abdominal swelling or bloating.  Feeling full or sick after eating a small or regular-size meal.  Frequent gas.  Mucus in the stool.  A feeling of having more stool left after a bowel movement.  Symptoms tend to come and go. They may be associated with stress, psychiatric conditions, or nothing at all. How is this diagnosed? There is no specific test to diagnose IBS. Your health care provider will make a diagnosis based on a physical exam, medical history, and your symptoms. You may have other tests to rule out other conditions that may be causing your symptoms. These may include:  Blood tests.  X-rays.  CT scan.  Endoscopy and colonoscopy. This is a test in which your GI tract is viewed with a long, thin, flexible tube.  How is this treated? There is no cure for IBS, but treatment can help relieve symptoms. IBS treatment often includes:  Changes to your diet, such as: ? Eating more fiber. ? Avoiding foods that cause symptoms. ? Drinking more water. ? Eating regular, medium-sized portioned meals.  Medicines. These may include: ? Fiber supplements if you have constipation. ? Medicine to control diarrhea (antidiarrheal medicines). ? Medicine to help control muscle spasms in your GI tract (antispasmodic medicines). ? Medicines to help with any mental health issues, such as antidepressants or tranquilizers.  Therapy. ?  Talk therapy may help with anxiety, depression, or other mental health issues that can make IBS symptoms worse.  Stress reduction. ? Managing your stress can help keep symptoms under control.  Follow these instructions at home:  Take medicines only as directed by your health care provider.  Eat a healthy diet. ? Avoid foods and drinks with added sugar. ? Include more whole grains, fruits, and vegetables gradually into your diet. This may be especially helpful if  you have IBS with constipation. ? Avoid any foods and drinks that make your symptoms worse. These may include dairy products and caffeinated or carbonated drinks. ? Do not eat large meals. ? Drink enough fluid to keep your urine clear or pale yellow.  Exercise regularly. Ask your health care provider for recommendations of good activities for you.  Keep all follow-up visits as directed by your health care provider. This is important. Contact a health care provider if:  You have constant pain.  You have trouble or pain with swallowing.  You have worsening diarrhea. Get help right away if:  You have severe and worsening abdominal pain.  You have diarrhea and: ? You have a rash, stiff neck, or severe headache. ? You are irritable, sleepy, or difficult to awaken. ? You are weak, dizzy, or extremely thirsty.  You have bright red blood in your stool or you have black tarry stools.  You have unusual abdominal swelling that is painful.  You vomit continuously.  You vomit blood (hematemesis).  You have both abdominal pain and a fever. This information is not intended to replace advice given to you by your health care provider. Make sure you discuss any questions you have with your health care provider. Document Released: 03/25/2005 Document Revised: 08/25/2015 Document Reviewed: 12/10/2013 [employer] Interactive Patient Education  2018 [employer]

## 2017-06-12 ENCOUNTER — Other Ambulatory Visit: Payer: Self-pay | Admitting: Adult Health

## 2017-06-12 DIAGNOSIS — E782 Mixed hyperlipidemia: Secondary | ICD-10-CM

## 2017-06-12 LAB — VITAMIN D 25 HYDROXY (VIT D DEFICIENCY, FRACTURES): Vit D, 25-Hydroxy: 22 ng/mL — ABNORMAL LOW (ref 30–100)

## 2017-06-12 LAB — CBC WITH DIFFERENTIAL/PLATELET
BASOS PCT: 0.4 %
Basophils Absolute: 20 cells/uL (ref 0–200)
EOS PCT: 1.6 %
Eosinophils Absolute: 78 cells/uL (ref 15–500)
HEMATOCRIT: 41.6 % (ref 35.0–45.0)
Hemoglobin: 14.4 g/dL (ref 11.7–15.5)
LYMPHS ABS: 1749 {cells}/uL (ref 850–3900)
MCH: 32.6 pg (ref 27.0–33.0)
MCHC: 34.6 g/dL (ref 32.0–36.0)
MCV: 94.1 fL (ref 80.0–100.0)
MPV: 11.4 fL (ref 7.5–12.5)
Monocytes Relative: 8.8 %
Neutro Abs: 2622 cells/uL (ref 1500–7800)
Neutrophils Relative %: 53.5 %
PLATELETS: 184 10*3/uL (ref 140–400)
RBC: 4.42 10*6/uL (ref 3.80–5.10)
RDW: 11.8 % (ref 11.0–15.0)
TOTAL LYMPHOCYTE: 35.7 %
WBC: 4.9 10*3/uL (ref 3.8–10.8)
WBCMIX: 431 {cells}/uL (ref 200–950)

## 2017-06-12 LAB — HEPATIC FUNCTION PANEL
AG Ratio: 1.3 (calc) (ref 1.0–2.5)
ALBUMIN MSPROF: 4.3 g/dL (ref 3.6–5.1)
ALT: 16 U/L (ref 6–29)
AST: 17 U/L (ref 10–35)
Alkaline phosphatase (APISO): 79 U/L (ref 33–130)
BILIRUBIN INDIRECT: 0.8 mg/dL (ref 0.2–1.2)
Bilirubin, Direct: 0.1 mg/dL (ref 0.0–0.2)
GLOBULIN: 3.2 g/dL (ref 1.9–3.7)
TOTAL PROTEIN: 7.5 g/dL (ref 6.1–8.1)
Total Bilirubin: 0.9 mg/dL (ref 0.2–1.2)

## 2017-06-12 LAB — URINALYSIS W MICROSCOPIC + REFLEX CULTURE
BACTERIA UA: NONE SEEN /HPF
BILIRUBIN URINE: NEGATIVE
Glucose, UA: NEGATIVE
Hgb urine dipstick: NEGATIVE
Hyaline Cast: NONE SEEN /LPF
LEUKOCYTE ESTERASE: NEGATIVE
Nitrites, Initial: NEGATIVE
PROTEIN: NEGATIVE
SQUAMOUS EPITHELIAL / LPF: NONE SEEN /HPF (ref <=5)
Specific Gravity, Urine: 1.031 (ref 1.001–1.03)
pH: 5.5 (ref 5.0–8.0)

## 2017-06-12 LAB — LIPID PANEL
CHOL/HDL RATIO: 5.5 (calc) — AB (ref <5.0)
CHOLESTEROL: 303 mg/dL — AB (ref <200)
HDL: 55 mg/dL (ref >50)
LDL CHOLESTEROL (CALC): 199 mg/dL — AB
Non-HDL Cholesterol (Calc): 248 mg/dL (calc) — ABNORMAL HIGH (ref <130)
Triglycerides: 274 mg/dL — ABNORMAL HIGH (ref <150)

## 2017-06-12 LAB — BASIC METABOLIC PANEL WITH GFR
BUN: 15 mg/dL (ref 7–25)
CHLORIDE: 105 mmol/L (ref 98–110)
CO2: 27 mmol/L (ref 20–32)
Calcium: 9.6 mg/dL (ref 8.6–10.4)
Creat: 0.66 mg/dL (ref 0.50–0.99)
GFR, Est African American: 107 mL/min/{1.73_m2} (ref >=60)
GFR, Est Non African American: 92 mL/min/{1.73_m2} (ref >=60)
GLUCOSE: 77 mg/dL (ref 65–99)
Potassium: 4.4 mmol/L (ref 3.5–5.3)
Sodium: 139 mmol/L (ref 135–146)

## 2017-06-12 LAB — NO CULTURE INDICATED

## 2017-06-12 LAB — HEMOGLOBIN A1C
Hgb A1c MFr Bld: 5.2 % of total Hgb (ref <5.7)
MEAN PLASMA GLUCOSE: 103 (calc)
eAG (mmol/L): 5.7 (calc)

## 2017-06-12 MED ORDER — ATORVASTATIN CALCIUM 80 MG PO TABS: ORAL_TABLET | ORAL | 2 refills | Status: DC

## 2017-07-17 ENCOUNTER — Encounter: Payer: Self-pay | Admitting: Adult Health

## 2018-06-05 ENCOUNTER — Other Ambulatory Visit: Payer: Self-pay | Admitting: Adult Health

## 2018-06-05 DIAGNOSIS — E782 Mixed hyperlipidemia: Secondary | ICD-10-CM

## 2018-06-05 DIAGNOSIS — F341 Dysthymic disorder: Secondary | ICD-10-CM

## 2018-07-09 ENCOUNTER — Other Ambulatory Visit: Payer: Self-pay | Admitting: Internal Medicine

## 2018-07-09 DIAGNOSIS — E782 Mixed hyperlipidemia: Secondary | ICD-10-CM

## 2018-07-09 DIAGNOSIS — F341 Dysthymic disorder: Secondary | ICD-10-CM

## 2018-07-09 MED ORDER — SERTRALINE HCL 50 MG PO TABS: ORAL_TABLET | ORAL | 1 refills | Status: AC

## 2018-07-09 MED ORDER — ATORVASTATIN CALCIUM 80 MG PO TABS: ORAL_TABLET | ORAL | 1 refills | Status: DC

## 2019-09-05 ENCOUNTER — Other Ambulatory Visit: Payer: Self-pay | Admitting: Internal Medicine

## 2019-09-05 DIAGNOSIS — F341 Dysthymic disorder: Secondary | ICD-10-CM

## 2019-09-05 DIAGNOSIS — E782 Mixed hyperlipidemia: Secondary | ICD-10-CM

## 2021-03-30 ENCOUNTER — Other Ambulatory Visit: Payer: Self-pay | Admitting: Internal Medicine

## 2021-03-30 MED ORDER — DEXAMETHASONE 4 MG PO TABS: ORAL_TABLET | ORAL | 0 refills | Status: DC

## 2021-03-30 MED ORDER — AZITHROMYCIN 250 MG PO TABS: ORAL_TABLET | ORAL | 1 refills | Status: DC

## 2021-03-30 MED ORDER — PROMETHAZINE-DM 6.25-15 MG/5ML PO SYRP: ORAL_SOLUTION | ORAL | 1 refills | Status: DC

## 2021-06-30 ENCOUNTER — Other Ambulatory Visit: Payer: Self-pay | Admitting: Internal Medicine

## 2021-11-10 ENCOUNTER — Other Ambulatory Visit: Payer: Self-pay | Admitting: Internal Medicine

## 2021-11-10 MED ORDER — DEXAMETHASONE 2 MG PO TABS: ORAL_TABLET | ORAL | 1 refills | Status: DC

## 2021-11-10 MED ORDER — DESOXIMETASONE 0.05 % EX CREA: TOPICAL_CREAM | Freq: Two times a day (BID) | CUTANEOUS | 1 refills | Status: DC

## 2021-11-10 MED ORDER — TRIAMCINOLONE ACETONIDE 0.5 % EX OINT: 1.0000 | TOPICAL_OINTMENT | Freq: Two times a day (BID) | CUTANEOUS | 1 refills | Status: AC

## 2022-09-21 ENCOUNTER — Other Ambulatory Visit: Payer: Self-pay | Admitting: Internal Medicine

## 2022-09-21 DIAGNOSIS — E782 Mixed hyperlipidemia: Secondary | ICD-10-CM

## 2022-09-21 MED ORDER — ATORVASTATIN CALCIUM 40 MG PO TABS: ORAL_TABLET | ORAL | 1 refills | Status: AC

## 2023-03-14 ENCOUNTER — Other Ambulatory Visit: Payer: Self-pay | Admitting: Internal Medicine

## 2023-03-14 MED ORDER — AZITHROMYCIN 250 MG PO TABS: ORAL_TABLET | ORAL | 1 refills | Status: AC

## 2023-03-14 MED ORDER — DEXAMETHASONE 4 MG PO TABS: ORAL_TABLET | ORAL | 1 refills | Status: AC
